# Patient Record
Sex: Male | Born: 1990
Health system: Southern US, Community
[De-identification: ages and names within clinical notes are randomized; demographics above are authoritative.]

## PROBLEM LIST (undated history)

## (undated) DIAGNOSIS — G4484 Primary exertional headache: Secondary | ICD-10-CM

## (undated) HISTORY — DX: Primary exertional headache: G44.84

## (undated) HISTORY — PX: SKIN BIOPSY: SHX1

---

## 2012-03-18 ENCOUNTER — Encounter (HOSPITAL_COMMUNITY): Payer: Self-pay | Admitting: Emergency Medicine

## 2012-03-18 ENCOUNTER — Emergency Department (HOSPITAL_COMMUNITY)
Admission: EM | Admit: 2012-03-18 | Discharge: 2012-03-18 | Disposition: A | Discharge status: Home / Self Care | Payer: BC Managed Care – PPO | Attending: Emergency Medicine | Admitting: Emergency Medicine

## 2012-03-18 DIAGNOSIS — J069 Acute upper respiratory infection, unspecified: Secondary | ICD-10-CM | POA: Insufficient documentation

## 2012-03-18 DIAGNOSIS — Z880 Allergy status to penicillin: Secondary | ICD-10-CM | POA: Insufficient documentation

## 2012-03-18 MED ORDER — PSEUDOEPHEDRINE HCL ER 120 MG PO TB12: 120.0000 mg | ORAL_TABLET | Freq: Two times a day (BID) | ORAL | Status: AC

## 2012-03-18 MED ORDER — DEXAMETHASONE 2 MG PO TABS
6.0000 mg | ORAL_TABLET | Freq: Once | ORAL | Status: AC
  Administered 2012-03-18: 6 mg via ORAL
  Filled 2012-03-18: qty 3

## 2012-03-18 NOTE — ED Provider Notes (Signed)
History     CSN: [id number]  Arrival date & time 03/18/12  0227   First MD Initiated Contact with Patient 03/18/12 0240      Chief Complaint  Patient presents with  . Nasal Congestion    (Consider location/radiation/quality/duration/timing/severity/associated sxs/prior treatment) HPI  No past medical history on file.  No past surgical history on file.  No family history on file.  History  Substance Use Topics  . Smoking status: Not on file  . Smokeless tobacco: Not on file  . Alcohol Use: Not on file      Review of Systems  Constitutional: Negative for chills and fatigue.  HENT: Positive for rhinorrhea, trouble swallowing, postnasal drip and sinus pressure. Negative for sore throat.   Respiratory: Negative for cough and shortness of breath.   Neurological: Negative for headaches.    Allergies  Penicillins  Home Medications   Current Outpatient Rx  Name Route Sig Dispense Refill  . ACETAMINOPHEN 325 MG PO TABS Oral Take 650 mg by mouth every 6 (six) hours as needed. For pain    . NYQUIL PO Oral Take by mouth daily as needed. For cough    . PSEUDOEPHEDRINE HCL ER 120 MG PO TB12 Oral Take 1 tablet (120 mg total) by mouth every 12 (twelve) hours. 20 tablet 0    BP 150/69  Pulse 88  Temp 98.2 F (36.8 C) (Oral)  Resp 18  SpO2 98%  Physical Exam  Constitutional: He appears well-developed and well-nourished.  HENT:  Mouth/Throat: Uvula is midline. Uvula swelling present.  Eyes: Pupils are equal, round, and reactive to light.  Neck: Normal range of motion.  Cardiovascular: Normal rate.   Abdominal: Soft.  Skin: Skin is warm.    ED Course  Procedures (including critical care time)   Labs Reviewed  RAPID STREP SCREEN  LAB REPORT - SCANNED   No results found.   1. URI (upper respiratory infection)       MDM          Arman Filter, NP 03/19/12 0501

## 2012-03-18 NOTE — ED Notes (Addendum)
Pt has cold; says he is having drainage. "Decided to run over here to make sure nothing serious." Throat is reddened and swollen and painful but pt is not febrile.

## 2012-03-19 NOTE — ED Provider Notes (Signed)
Medical screening examination/treatment/procedure(s) were performed by non-physician practitioner and as supervising physician I was immediately available for consultation/collaboration.  Olivia Mackie, MD 03/19/12 [phone]

## 2014-01-21 ENCOUNTER — Emergency Department (HOSPITAL_COMMUNITY)
Admission: EM | Admit: 2014-01-21 | Discharge: 2014-01-21 | Disposition: A | Discharge status: Home / Self Care | Payer: BC Managed Care – PPO | Attending: Emergency Medicine | Admitting: Emergency Medicine

## 2014-01-21 ENCOUNTER — Encounter (HOSPITAL_COMMUNITY): Payer: Self-pay | Admitting: Emergency Medicine

## 2014-01-21 DIAGNOSIS — F419 Anxiety disorder, unspecified: Secondary | ICD-10-CM

## 2014-01-21 DIAGNOSIS — F411 Generalized anxiety disorder: Secondary | ICD-10-CM | POA: Insufficient documentation

## 2014-01-21 DIAGNOSIS — Z880 Allergy status to penicillin: Secondary | ICD-10-CM | POA: Insufficient documentation

## 2014-01-21 DIAGNOSIS — Z79899 Other long term (current) drug therapy: Secondary | ICD-10-CM | POA: Insufficient documentation

## 2014-01-21 LAB — CBG MONITORING, ED: GLUCOSE-CAPILLARY: 80 mg/dL (ref 70–99)

## 2014-01-21 MED ORDER — LORAZEPAM 1 MG PO TABS
0.5000 mg | ORAL_TABLET | Freq: Once | ORAL | Status: AC
  Administered 2014-01-21: 0.5 mg via ORAL
  Filled 2014-01-21: qty 1

## 2014-01-21 MED ORDER — LORAZEPAM 0.5 MG PO TABS: 0.5000 mg | ORAL_TABLET | Freq: Three times a day (TID) | ORAL | Status: DC | PRN

## 2014-01-21 NOTE — ED Provider Notes (Signed)
CSN: [id number]     Arrival date & time 01/21/14  1925 History   First MD Initiated Contact with Patient 01/21/14 2012     Chief Complaint  Patient presents with  . Anxiety     (Consider location/radiation/quality/duration/timing/severity/associated sxs/prior Treatment) HPI  The patient presents to the emergency departments with complaints of anxiety and persistent thoughts about death and dying for the past 2-3 days. He says that his on and had a near death experience last week and since then he can't seem to stop thinking about and being worried about him or those is close to dying. He spoke with his dad he was out of town and a psychiatrist who recommended he seek out medical help this evening. He says he is still eating and drinking and keep up with ADLs. But he has been anxious, Thirsty, heart racing, chest tightness that is intermittent. He says he had this problem before as a child but since his dad is a psychiatrist he was able to talk him through it. He denies having SI/HI, hallucinations or delusions. He denies feeling like he needs inpatient treatment but wanted help to get rid of "this feeling".  History reviewed. No pertinent past medical history. History reviewed. No pertinent past surgical history. No family history on file. History  Substance Use Topics  . Smoking status: Never Smoker   . Smokeless tobacco: Not on file  . Alcohol Use: Yes    Review of Systems  All other systems reviewed and are negative.     Allergies  Penicillins  Home Medications   Prior to Admission medications   Medication Sig Start Date End Date Taking? Authorizing Provider  OVER THE COUNTER MEDICATION Take 1 tablet by mouth as needed (for allergies). Pt doesn't know name or strength of allergy medicine   Yes Historical Provider, MD  LORazepam (ATIVAN) 0.5 MG tablet Take 1 tablet (0.5 mg total) by mouth 3 (three) times daily as needed for anxiety. 01/21/14   Chantrell Apsey Irine Seal, PA-C   BP  142/77  Pulse 81  Temp(Src) 98.6 F (37 C) (Oral)  Resp 25  Ht 5\' 10"  (1.778 m)  Wt 214 lb (97.07 kg)  BMI 30.71 kg/m2  SpO2 99% Physical Exam  Nursing note and vitals reviewed. Constitutional: He appears well-developed and well-nourished. No distress.  HENT:  Head: Normocephalic and atraumatic.  Eyes: Pupils are equal, round, and reactive to light.  Neck: Normal range of motion. Neck supple.  Cardiovascular: Normal rate and regular rhythm.   Pulmonary/Chest: Effort normal.  Abdominal: Soft.  Neurological: He is alert.  Skin: Skin is warm and dry.  Psychiatric: His speech is normal and behavior is normal. Judgment normal. His mood appears anxious. Cognition and memory are normal. He expresses no homicidal and no suicidal ideation. He expresses no suicidal plans and no homicidal plans.    ED Course  Procedures (including critical care time) Labs Review Labs Reviewed  CBG MONITORING, ED    Imaging Review No results found.   EKG Interpretation None      MDM   Final diagnoses:  Anxiety    No SI or HI patient does not appear to be in significant distress and is competent. He does not want to wait to speak without our counselors. Will go to behavioral health tomorrow as a walk in. Given  Medications  LORazepam (ATIVAN) tablet 0.5 mg (0.5 mg Oral Given 01/21/14 2100)    in the ED which he says really helped him out a  lot. Will give him an Rx for the same to help bridge until his daily medication that he [facility] starts to work.  22 y.o.Casimiro NeedleMichael Caligiuri's evaluation in the Emergency Department is complete. It has been determined that no acute conditions requiring further emergency intervention are present at this time. The patient/guardian have been advised of the diagnosis and plan. We have discussed signs and symptoms that warrant return to the ED, such as changes or worsening in symptoms.  Vital signs are stable at discharge. Filed Vitals:   01/21/14  2215  BP: 116/59  Pulse: 72  Temp:   Resp: 17    Patient/guardian has voiced understanding and agreed to follow-up with the PCP or specialist.     Dorthula Matasiffany G Karry Causer, PA-C 01/21/14 2232

## 2014-01-21 NOTE — Discharge Instructions (Signed)
Panic Attacks °Panic attacks are sudden, short-lived surges of severe anxiety, fear, or discomfort. They may occur for no reason when you are relaxed, when you are anxious, or when you are sleeping. Panic attacks may occur for a number of reasons:  °· Healthy people occasionally have panic attacks in extreme, life-threatening situations, such as war or natural disasters. Normal anxiety is a protective mechanism of the body that helps us react to danger (fight or flight response). °· Panic attacks are often seen with anxiety disorders, such as panic disorder, social anxiety disorder, generalized anxiety disorder, and phobias. Anxiety disorders cause excessive or uncontrollable anxiety. They may interfere with your relationships or other life activities. °· Panic attacks are sometimes seen with other mental illnesses, such as depression and posttraumatic stress disorder. °· Certain medical conditions, prescription medicines, and drugs of abuse can cause panic attacks. °SYMPTOMS  °Panic attacks start suddenly, peak within 20 minutes, and are accompanied by four or more of the following symptoms: °· Pounding heart or fast heart rate (palpitations). °· Sweating. °· Trembling or shaking. °· Shortness of breath or feeling smothered. °· Feeling choked. °· Chest pain or discomfort. °· Nausea or strange feeling in your stomach. °· Dizziness, light-headedness, or feeling like you will faint. °· Chills or hot flushes. °· Numbness or tingling in your lips or hands and feet. °· Feeling that things are not real or feeling that you are not yourself. °· Fear of losing control or going crazy. °· Fear of dying. °Some of these symptoms can mimic serious medical conditions. For example, you may think you are having a heart attack. Although panic attacks can be very scary, they are not life threatening. °DIAGNOSIS  °Panic attacks are diagnosed through an assessment by your health care provider. Your health care provider will ask  questions about your symptoms, such as where and when they occurred. Your health care provider will also ask about your medical history and use of alcohol and drugs, including prescription medicines. Your health care provider may order blood tests or other studies to rule out a serious medical condition. Your health care provider may refer you to a mental health professional for further evaluation. °TREATMENT  °· Most healthy people who have one or two panic attacks in an extreme, life-threatening situation will not require treatment. °· The treatment for panic attacks associated with anxiety disorders or other mental illness typically involves counseling with a mental health professional, medicine, or a combination of both. Your health care provider will help determine what treatment is best for you. °· Panic attacks due to physical illness usually go away with treatment of the illness. If prescription medicine is causing panic attacks, talk with your health care provider about stopping the medicine, decreasing the dose, or substituting another medicine. °· Panic attacks due to alcohol or drug abuse go away with abstinence. Some adults need professional help in order to stop drinking or using drugs. °HOME CARE INSTRUCTIONS  °· Take all medicines as directed by your health care provider.   °· Schedule and attend follow-up visits as directed by your health care provider. It is important to keep all your appointments. °SEEK MEDICAL CARE IF: °· You are not able to take your medicines as prescribed. °· Your symptoms do not improve or get worse. °SEEK IMMEDIATE MEDICAL CARE IF:  °· You experience panic attack symptoms that are different than your usual symptoms. °· You have serious thoughts about hurting yourself or others. °· You are taking medicine for panic attacks and   have a serious side effect. °MAKE SURE YOU: °· Understand these instructions. °· Will watch your condition. °· Will get help right away if you are not  doing well or get worse. °Document Released: 06/22/2005 Document Revised: 06/27/2013 Document Reviewed: 02/03/2013 °ExitCare® Patient Information ©2015 [employer]. This information is not intended to replace advice given to you by your health care provider. Make sure you discuss any questions you have with your health care provider. ° °Emergency Department Resource Guide °1) Find a Doctor and Pay Out of Pocket °Although you won't have to find out who is covered by your insurance plan, it is a good idea to ask around and get recommendations. You will then need to call the office and see if the doctor you have chosen will accept you as a new patient and what types of options they offer for patients who are self-pay. Some doctors offer discounts or will set up payment plans for their patients who do not have insurance, but you will need to ask so you aren't surprised when you get to your appointment. ° °2) Contact Your Local Health Department °Not all health departments have doctors that can see patients for sick visits, but many do, so it is worth a call to see if yours does. If you don't know where your local health department is, you can check in your phone book. The CDC also has a tool to help you locate your state's health department, and many state websites also have listings of all of their local health departments. ° °3) Find a Walk-in Clinic °If your illness is not likely to be very severe or complicated, you may want to try a walk in clinic. These are popping up all over the country in pharmacies, drugstores, and shopping centers. They're usually staffed by nurse practitioners or physician assistants that have been trained to treat common illnesses and complaints. They're usually fairly quick and inexpensive. However, if you have serious medical issues or chronic medical problems, these are probably not your best option. ° °No Primary Care Doctor: °- Call [facility] at  832-8000 - they can help you  locate a primary care doctor that  accepts your insurance, provides certain services, etc. °- Physician Referral Service- 1-800-533-3463 ° °Chronic Pain Problems: °Organization         Address  Phone   Notes  ° [facility]  (336) 297-2271 Patients need to be referred by their primary care doctor.  ° °Medication Assistance: °Organization         Address  Phone   Notes  °[place] [facility] [address] °[place], [place] [address] (336) 641-8030 --Must be a resident of [place] °-- Must have NO insurance coverage whatsoever (no Medicaid/ Medicare, etc.) °-- The pt. MUST have a primary care doctor that directs their care regularly and follows them in the community °  °[facility]  (866) 331-1348   °[employer]  (888) 892-1162   ° °Agencies that provide inexpensive medical care: °Organization         Address  Phone   Notes  °[facility]  (336) 832-8035   ° Internal Medicine    (336) 832-7272   °[facility] [address] °[place], [place] [address] (336) 832-4777   °[facility] [address], °[place] (336) 271-4999   °[facility]    (336) 373-0678   °[facility]    (336) 272-1050   °  [facility] ° [address], [place] Phone:  (336) 832-4444, Fax:  (336) 832-4440 Hours of Operation:  9 am - 6 pm, M-F.  Also accepts Medicaid/Medicare and self-pay.  °[facility] ° [address], [place] Phone: (336) 832-3150, Fax: (336) [phone]. Hours of Operation:  8:30 am - 5:30 pm, M-F.  Also accepts Medicaid and self-pay.  °[facility] [place] [address], [place] Phone: (336) 878-6027   °[facility] [address], [place], [place] (336)723-1848, Ext. 123 Mondays & Thursdays: 7-9 AM.  First 15 patients are seen on a first come, first serve basis. °  ° °Medicaid-accepting [place]  Providers: ° °Organization         Address  Phone   Notes  °[facility] [address], [place] (336) 641-2100 Also accepts self-pay patients.  °[facility] [address], [place] ° (336) 856-9996   °[facility] [address], [place] (336) 288-8857   °[facility] [address] [place] [address], [place] (336) 299-7000   °Veita Bland [address], [place]  ° (336) 373-1557 Only accepts [place] Access Medicaid patients after they have their name applied to their card.  ° °Self-Pay (no insurance) in [place]: ° °Organization         Address  Phone   Notes  °Sickle Cell Patients, [facility] [address], [place] (336) 832-1970   °[facility] [address], [place] (336) 832-4400   °[facility] ° [address], [place] (336) 992-4800   °[facility]/Dr. Osei-Bonsu ° [address] [place] [address], [place] or [address], [place] (336) 841-8500 Phone number for both [place] and [place] locations is the same.  °[facility] [address], [place] (336) 299-0000   °[facility] [address] [place] [address], [place] or [address] (336) 852-7530 °(336) 878-2260   °[facility] [address], [place] (336) 350-1642, phone; (336) 294-5005, fax Sees patients 1st and 3rd Saturday of every month.  Must not qualify for public or private insurance (i.e. Medicaid, Medicare, [place] Health Choice, Veterans' Benefits) • Household income should be no more than 200% of the poverty level •The clinic cannot treat you if you are pregnant or think you are pregnant • Sexually transmitted diseases are not treated at the clinic.  ° ° °Dental Care: °Organization         Address  Phone  Notes  °[facility] [address], [place] (336) 641-6152 Accepts children up to age 21 who are enrolled in Medicaid or [place] Health Choice; pregnant women with a Medicaid card; and children who have applied for Medicaid or [place] Health Choice, but were declined, whose parents can pay a reduced fee at time of service.  °[facility] [place]  [address], [place] (336) 641-7733 Accepts children up to age 21 who are enrolled in Medicaid or [place] Health Choice; pregnant women with a Medicaid card; and children who have applied for Medicaid or [place] Health Choice, but were declined, whose parents can pay a reduced fee at time of service.  °[facility] ° [address], [place] (336) 641-4533 Patients are seen by appointment only. Walk-ins are not accepted. [facility] will see patients 18 years of age and older. °Monday - Tuesday (8am-5pm) °Most Wednesdays (8:30-5pm) °$30 per visit, cash only  °[facility] ° [address], [place] (336) 641-4533 Patients are seen by appointment only. Walk-ins are not accepted. [facility] will see patients 18 years of age and older. °One Wednesday Evening (Monthly: Volunteer Based).  $30 per visit, cash only  °[facility]  (919) 537-3737 for adults; Children under age 4, call Graduate Pediatric Dentistry at (919) [phone]. Children aged 4-14, please call (919) [phone] to request a pediatric application. ° Dental services are provided in all areas of dental care including fillings, crowns and bridges, complete and partial dentures, implants, gum treatment, root canals, and extractions. Preventive care is also provided. Treatment is provided to both adults and children. °Patients are selected via a lottery and there is often a waiting list. °  °[facility] [address], °[place] ° (336) 763-8833 [email or web address] °  °[facility]  [address], [place], [place] (336)723-1848, Ext. 123 Second and Fourth Thursday of each month, opens at 6:30 AM; Clinic ends at 9 AM.  Patients are seen on a first-come first-served basis, and a limited number are seen during each clinic.  ° °[facility] ° [address], [place], [place] (336) 723-7904   Eligibility Requirements °You must have lived in [place], [place], or [place] counties for at least the last three months. °  You cannot be eligible for state or federal sponsored healthcare insurance, including Veterans Administration, Medicaid, or Medicare. °  You generally cannot be eligible for healthcare insurance through your employer.  °  How to apply: °Eligibility screenings are held every Tuesday and Wednesday afternoon from 1:00 pm until 4:00 pm. You do not need an appointment for the interview!  °[facility] [address], [place], [place] 336-631-2330   °[facility]  336-342-8273   °[facility]  336-703-3100   °[facility]  336-570-6415   ° °Behavioral Health Resources in the Community: °Intensive Outpatient Programs °Organization         Address  Phone  Notes  °[facility] [address], [place], [place] 336-878-6098   °[facility] [address], [place], [place] 336-832-9800   °[facility] [address], [place], [place] ° 336-882-2125   °[facility] [address],  °[place], [place] 1-800-853-5163 or 336-641-4981   °Substance Abuse Resources °Organization         Address  Phone  Notes  °[facility]  336-882-2125   °[facility]  336-784-9470   °[facility]  336-285-9073   °[facility]  336-845-3988   °Residential & Outpatient Substance Abuse Program  1-800-659-3381   °Psychological Services °Organization         Address  Phone  Notes  °[facility]  336- 832-9600     °[facility]  336- 378-7881   °[facility] [address], [place] 1-800-853-5163 or 336-641-4981   ° °Mobile Crisis Teams °Organization         Address  Phone  Notes  °[facility], Mobile Crisis Care Unit  1-877-626-1772   °Assertive °[facility] ° [address] [place], [place] 336-834-9664   °  Sharon DeEsch [address] °[place] 336-554-5454   ° °Self-Help/Support Groups °Organization         Address  Phone             Notes  °[facility] [place] - variety of support groups  336- 373-1402 Call for more information  °Narcotics Anonymous (NA), [facility] [address], °[place]  2 meetings at this location  ° °Residential Treatment Programs °Organization         Address  Phone  Notes  °[facility] [address],    °[place]  1-866-801-8205   °[facility] ° [address], [place], [place] 704-293-8524   °[facility] [address], [place] 336-845-3988 Admissions: 8am-3pm M-F  °[facility] [address]    °[place], [place] 336-841-1104   °[facility] [address], [place], [place] 336-379-7146   °[facility] [address]  °[place], [place] 336-285-9073   °[facility] - Intensive Outpatient [address], [place], [place] 336-852-3033   °[facility] [address]  °[place], [place] 1-877-615-2722 or 336-784-9470   °[facility] [address] [place], [place] 336-227-7417 Accepts Medicaid  °[facility] [address]  ° [place] 1-800-659-3381 Substance Abuse/Addiction Treatment  ° °[place] Behavioral Health Resources °Organization         Address  Phone  Notes  °[facility]  (888) 581-9988   °Julie Brannon, PhD [address] [place], [place]   (336) 349-5553 or (336) 951-0000    °[facility]   [address] °[place], [place] (336) 349-4454   °[facility] [address], [place], [place] (336) 342-8316 Insurance/Medicaid/sponsorship through [facility]  °[facility] [address]                                    [place], [place] (336) 342-8316 Therapy/tele-psych/case  °[facility] [address]  ° [place],  (336) 349-2233    °Dr. Arfeen  (336) 349-4544   °[facility] [place]  [employer] [place] [facility] 1) [address], [place] °2) [address], [place] °3)  [address], [place] (336) 349-3220 °(336) 342-7768 ° °(336) 342-8140   °[place] [facility] (336) 342-1394 or (336) 342-3537 (After Hours)    ° ° ° °

## 2014-01-21 NOTE — ED Provider Notes (Signed)
Medical screening examination/treatment/procedure(s) were performed by non-physician practitioner and as supervising physician I was immediately available for consultation/collaboration.   EKG Interpretation None        Anastasio Wogan M Kenzlee Fishburn, MD 01/21/14 2354 

## 2014-01-21 NOTE — ED Notes (Signed)
The pt has anxiety issues for 2-3 days.  He has personal rpoblems

## 2017-03-18 ENCOUNTER — Ambulatory Visit (INDEPENDENT_AMBULATORY_CARE_PROVIDER_SITE_OTHER): Payer: BLUE CROSS/BLUE SHIELD | Admitting: Podiatry

## 2017-03-18 ENCOUNTER — Encounter: Payer: Self-pay | Admitting: Podiatry

## 2017-03-18 VITALS — BP 110/70 | HR 92 | Resp 16

## 2017-03-18 DIAGNOSIS — L600 Ingrowing nail: Secondary | ICD-10-CM | POA: Diagnosis not present

## 2017-03-18 MED ORDER — NEOMYCIN-POLYMYXIN-HC 1 % OT SOLN: OTIC | 1 refills | Status: DC

## 2017-03-18 NOTE — Patient Instructions (Signed)

## 2017-03-18 NOTE — Progress Notes (Signed)
  Subjective:  Patient ID: Richard Glenn, male    DOB: 06/25/1991,  MRN: [id number] HPI Chief Complaint  Patient presents with  . Toe Pain    Hallux nail left - lateral border, sharp when pressed x 2 weeks, no treamtment    26 y.o. male presents with the above complaint.   He presents today complaining of painful ingrown toenail to the fibular border of the hallux left today. States is been present for quite some time has had a removed in the past but it grew back.  No past medical history on file. No past surgical history on file. No current outpatient prescriptions on file.  Allergies  Allergen Reactions  . Penicillins Other (See Comments)    Unknown. Pt says he thinks it's something similar but not entirely sure   Review of Systems  All other systems reviewed and are negative.  Objective:   Vitals:   03/18/17 0946  BP: 110/70  Pulse: 92  Resp: 16   General AA&O x3. Patient is alert and oriented.  Vascular Dorsalis pedis and posterior tibial pulses 2/4 bilat. Brisk capillary refill to all digits. Pedal hair present.  Neurologic Epicritic sensation grossly intact.  Dermatologic No open lesions. Interspaces clear of maceration. Nails well groomed and normal in appearance.Sharp incurvated nail margin along the fibular border hallux left. There is mild purulence mild Malott malodor with Sharp incurvated nail margin.   Orthopedic: MMT 5/5 in dorsiflexion, plantarflexion, inversion, and eversion. Normal joint ROM without pain or crepitus.   Radiographs:  None needed  Assessment & Plan:   Assessment: Ingrown toenail paronychia abscess hallux left. Fibular border.  Plan: We discussed the etiology pathology conservative versus surgical therapies. At this point we performed a chemical matrixectomy to the fibular border of the hallux left. He tolerated this procedure well after local anesthesia was administered. He was provided with oral and written home-going instructions  for care and soaking of his toe as well as a prescription for Cortisporin Otic to be applied twice daily after soaking. Follow up with him in 2 weeks.     Benita Boonstra T. KremlinHyatt, North DakotaDPM

## 2017-03-19 ENCOUNTER — Telehealth: Payer: Self-pay | Admitting: *Deleted

## 2017-03-19 NOTE — Telephone Encounter (Signed)
Pt states due to weather would it be okay to start with epsom salt soaks, then later switch to betadine. I told pt that would be okay.

## 2017-04-08 ENCOUNTER — Encounter: Payer: Self-pay | Admitting: Podiatry

## 2017-04-08 ENCOUNTER — Ambulatory Visit (INDEPENDENT_AMBULATORY_CARE_PROVIDER_SITE_OTHER): Payer: BLUE CROSS/BLUE SHIELD | Admitting: Podiatry

## 2017-04-08 ENCOUNTER — Ambulatory Visit: Payer: BLUE CROSS/BLUE SHIELD | Admitting: Podiatry

## 2017-04-08 DIAGNOSIS — L600 Ingrowing nail: Secondary | ICD-10-CM

## 2017-04-08 NOTE — Patient Instructions (Signed)

## 2017-04-09 NOTE — Progress Notes (Signed)
He presents today for follow-up of the hallux nail left. States this seems to be doing very well and is nontender. He states these no longer soaking it. Her graft objective: Vital signs are stable alert and oriented 3. Pulses are palpable. There is mild erythema at the proximal fibular border of the hallux left. It is mildly tender on palpation.  Assessment: Well-healing surgical toe still mild drainage and tenderness.  Plan:: I recommended that he continue to soak at least once daily in Epsom salts and warm water was for signs and symptoms of infection notify me if there are any.

## 2017-04-27 ENCOUNTER — Ambulatory Visit (INDEPENDENT_AMBULATORY_CARE_PROVIDER_SITE_OTHER): Payer: BLUE CROSS/BLUE SHIELD | Admitting: Podiatry

## 2017-04-27 ENCOUNTER — Encounter: Payer: Self-pay | Admitting: Podiatry

## 2017-04-27 DIAGNOSIS — L600 Ingrowing nail: Secondary | ICD-10-CM

## 2017-04-27 MED ORDER — NEOMYCIN-POLYMYXIN-HC 1 % OT SOLN: OTIC | 1 refills | Status: DC

## 2017-04-27 NOTE — Progress Notes (Signed)
He presents today with a chief complaint of ingrown toenail to the lateral border of the hallux right.  Objective: Pulses are palpable. Sharp incurvated nail margin with erythema and edema to the fibular border. Painful on palpation.  Assessment: Ingrown nail paronychia abscess hallux right.  Plan: Chemical matrixectomy was performed today after local anesthesia was administered. He tolerated the procedure well. He was provided with both oral and then home-going instructions. He was provided with a prescription for Cortisporin Otic to be applied twice daily after soaking. I will follow-up with him in 2 weeks.

## 2017-04-27 NOTE — Patient Instructions (Signed)

## 2017-05-11 ENCOUNTER — Ambulatory Visit (INDEPENDENT_AMBULATORY_CARE_PROVIDER_SITE_OTHER): Payer: BLUE CROSS/BLUE SHIELD | Admitting: Podiatry

## 2017-05-11 ENCOUNTER — Encounter: Payer: Self-pay | Admitting: Podiatry

## 2017-05-11 DIAGNOSIS — L600 Ingrowing nail: Secondary | ICD-10-CM

## 2017-05-11 NOTE — Patient Instructions (Signed)

## 2017-05-11 NOTE — Progress Notes (Signed)
He presents today for follow-up of his matrixectomy hallux right fibular border. He states that is doing real well. It has a scab on it so I have not been soaking it.  Objective: Vital signs are stable he is alert and oriented 3. Pulses are palpable. No erythema cellulitis drainage or odor eschar to the lateral border is present. I see no signs of drainage at this point.  Assessment: Well-healing surgical toe hallux right fibular border.  Plan: Discussed etiology pathology conservative versus surgical therapies will follow up with him as needed. He will watch sun symptoms of infection.

## 2018-07-13 ENCOUNTER — Encounter (HOSPITAL_COMMUNITY): Payer: Self-pay | Admitting: Emergency Medicine

## 2018-07-13 ENCOUNTER — Emergency Department (HOSPITAL_COMMUNITY)
Admission: EM | Admit: 2018-07-13 | Discharge: 2018-07-14 | Disposition: A | Discharge status: Home / Self Care | Payer: 59 | Attending: Emergency Medicine | Admitting: Emergency Medicine

## 2018-07-13 DIAGNOSIS — Z79899 Other long term (current) drug therapy: Secondary | ICD-10-CM | POA: Diagnosis not present

## 2018-07-13 DIAGNOSIS — H9201 Otalgia, right ear: Secondary | ICD-10-CM | POA: Diagnosis not present

## 2018-07-13 NOTE — ED Triage Notes (Signed)
Pt reports right ear pain X3 days.  Pt went to an ENT provider today, got medication however the pain is not controlled.  Has been managing w/ OTC however it is not working now.

## 2018-07-14 MED ORDER — HYDROCODONE-ACETAMINOPHEN 5-325 MG PO TABS
1.0000 | ORAL_TABLET | Freq: Once | ORAL | Status: AC
  Administered 2018-07-14: 1 via ORAL
  Filled 2018-07-14: qty 1

## 2018-07-14 MED ORDER — ONDANSETRON HCL 4 MG PO TABS: 4.0000 mg | ORAL_TABLET | Freq: Three times a day (TID) | ORAL | 0 refills | Status: DC | PRN

## 2018-07-14 MED ORDER — HYDROCODONE-ACETAMINOPHEN 5-325 MG PO TABS: 1.0000 | ORAL_TABLET | Freq: Four times a day (QID) | ORAL | 0 refills | Status: DC | PRN

## 2018-07-14 MED ORDER — ONDANSETRON 4 MG PO TBDP
4.0000 mg | ORAL_TABLET | Freq: Once | ORAL | Status: AC
Start: 2018-07-14 — End: 2018-07-14
  Administered 2018-07-14: 4 mg via ORAL
  Filled 2018-07-14: qty 1

## 2018-07-14 NOTE — ED Provider Notes (Signed)
[facility] Provider Note   CSN: [id number] Arrival date & time: 07/13/18  2330     History   Chief Complaint Chief Complaint  Patient presents with  . Otalgia    HPI Richard Glenn is a 28 y.o. male who presents to the emergency department chief complaint of right ear pain.  [facility] diagnosed with otitis externa.  He had an ear wick placed and was placed on eardrops.  He states at that time he was taking Motrin and Tylenol and his pain was approximately 7 out of 10.  He felt that he could handle it with over-the-counter medication.  However tonight his ear began throbbing and is now 9 out of 10.  He has been unable to sleep.  He denies fevers chills neck stiffness severe headache pain in his mastoid rashes.   HPI  History reviewed. No pertinent past medical history.  There are no active problems to display for this patient.   History reviewed. No pertinent surgical history.      Home Medications    Prior to Admission medications   Medication Sig Start Date End Date Taking? Authorizing Provider  citalopram (CELEXA) 20 MG tablet Take 20 mg by mouth daily. 03/09/17   [provider]  HYDROcodone-acetaminophen (NORCO) 5-325 MG tablet Take 1-2 tablets by mouth every 6 (six) hours as needed for moderate pain. 07/14/18   Nailah Luepke, PA-C  NEOMYCIN-POLYMYXIN-HYDROCORTISONE (CORTISPORIN) 1 % SOLN OTIC solution Apply 1-2 drops to toe BID after soaking 04/27/17   Hyatt, Max T, DPM  ondansetron (ZOFRAN) 4 MG tablet Take 1 tablet (4 mg total) by mouth every 8 (eight) hours as needed for nausea or vomiting. 07/14/18   Arthor CaptainHarris, Abbygayle Helfand, PA-C    Family History No family history on file.  Social History Social History   Tobacco Use  . Smoking status: Never Smoker  . Smokeless tobacco: Never Used  Substance Use Topics  . Alcohol use: Yes  . Drug use: Not on file     Allergies    Penicillins   Review of Systems Review of Systems Ten systems reviewed and are negative for acute change, except as noted in the HPI.    Physical Exam Updated Vital Signs BP (!) 147/86 (BP Location: Right Arm)   Pulse (!) 101   Temp 98 F (36.7 C) (Oral)   Resp 16   Ht 5\' 11"  (1.803 m)   Wt 117 kg   SpO2 98%   BMI 35.98 kg/m   Physical Exam Vitals signs and nursing note reviewed.  Constitutional:      General: He is not in acute distress.    Appearance: He is well-developed. He is not diaphoretic.  HENT:     Head: Normocephalic and atraumatic.     Comments:   No mastoid tenderness.  Ear wick in place in the left ear occluding the canal.    Left Ear: Tympanic membrane normal.  Eyes:     General: No scleral icterus.    Conjunctiva/sclera: Conjunctivae normal.  Neck:     Musculoskeletal: Normal range of motion and neck supple.  Cardiovascular:     Rate and Rhythm: Normal rate and regular rhythm.     Heart sounds: Normal heart sounds.  Pulmonary:     Effort: Pulmonary effort is normal. No respiratory distress.     Breath sounds: Normal breath sounds.  Abdominal:     Palpations: Abdomen is soft.  Tenderness: There is no abdominal tenderness.  Skin:    General: Skin is warm and dry.  Neurological:     Mental Status: He is alert.  Psychiatric:        Behavior: Behavior normal.      ED Treatments / Results  Labs (all labs ordered are listed, but only abnormal results are displayed) Labs Reviewed - No data to display  EKG None  Radiology No results found.  Procedures Procedures (including critical care time)  Medications Ordered in ED Medications  HYDROcodone-acetaminophen (NORCO/VICODIN) 5-325 MG per tablet 1 tablet (1 tablet Oral Given 07/14/18 0303)  ondansetron (ZOFRAN-ODT) disintegrating tablet 4 mg (4 mg Oral Given 07/14/18 0303)     Initial Impression / Assessment and Plan / ED Course  I have reviewed the triage vital signs and the nursing  notes.  Pertinent labs & imaging results that were available during my care of the patient were reviewed by me and considered in my medical decision making (see chart for details).     PDMP reviewed during this encounter. Patient with ear pain worsening at night.  Unable to sleep.  I reviewed the PDMP and patient will be discharged with Norco and Zofran.  Discussed return precautions.  Appears appropriate for discharge at this time  Final Clinical Impressions(s) / ED Diagnoses   Final diagnoses:  Right ear pain    ED Discharge Orders         Ordered    HYDROcodone-acetaminophen (NORCO) 5-325 MG tablet  Every 6 hours PRN     07/14/18 0316    ondansetron (ZOFRAN) 4 MG tablet  Every 8 hours PRN,   Status:  Discontinued     07/14/18 0316    ondansetron (ZOFRAN) 4 MG tablet  Every 8 hours PRN     07/14/18 0319           Arthor Captain, PA-C 07/14/18 0323    Dione Booze, MD 07/14/18 [phone]

## 2018-07-14 NOTE — Discharge Instructions (Addendum)
You were given narcotic and or sedative medications while in the emergency department. Do not drive. Do not use machinery or power tools. Do not sign legal documents. Do not drink alcohol. Do not take sleeping pills. Do not supervise children by yourself. Do not participate in activities that require climbing or being in high places.  Get help right away if: You have a severe headache. You have a stiff neck. You have trouble swallowing. You have redness or swelling behind your ear. You have fluid or blood coming from your ear. You have hearing loss. You feel dizzy.

## 2018-07-26 ENCOUNTER — Telehealth: Payer: Self-pay

## 2018-07-26 NOTE — Telephone Encounter (Signed)
SENT REFERRAL TO SCHEDULING AND FILED NOTES 

## 2018-10-10 ENCOUNTER — Telehealth: Payer: 59 | Admitting: Family

## 2018-10-10 DIAGNOSIS — R6889 Other general symptoms and signs: Principal | ICD-10-CM

## 2018-10-10 DIAGNOSIS — Z20822 Contact with and (suspected) exposure to covid-19: Secondary | ICD-10-CM

## 2018-10-10 MED ORDER — ALBUTEROL SULFATE HFA 108 (90 BASE) MCG/ACT IN AERS: 2.0000 | INHALATION_SPRAY | Freq: Four times a day (QID) | RESPIRATORY_TRACT | 0 refills | Status: DC | PRN

## 2018-10-10 MED ORDER — BENZONATATE 100 MG PO CAPS: 100.0000 mg | ORAL_CAPSULE | Freq: Three times a day (TID) | ORAL | 0 refills | Status: DC | PRN

## 2018-10-10 NOTE — Progress Notes (Signed)
E-Visit for Corona Virus Screening  Based on your current symptoms, you may very well have the virus, however your symptoms are mild. Currently, not all patients are being tested. If the symptoms are mild and there is not a known exposure, performing the test is not indicated.  Approximately 5 minutes was spent documenting and reviewing patient's chart.   Coronavirus disease 2019 (COVID-19) is a respiratory illness that can spread from person to person. The virus that causes COVID-19 is a new virus that was first identified in the country of [place] but is now found in multiple other countries and has spread to the [place].  Symptoms associated with the virus are mild to severe fever, cough, and shortness of breath. There is currently no vaccine to protect against COVID-19, and there is no specific antiviral treatment for the virus.   To be considered HIGH RISK for Coronavirus (COVID-19), you have to meet the following criteria:  . Traveled to [place], [place], [place], [place] or [place]; or in the [place] to [place], [place], [place], or [place]; and have fever, cough, and shortness of breath within the last 2 weeks of travel OR  . Been in close contact with a person diagnosed with COVID-19 within the last 2 weeks and have fever, cough, and shortness of breath  . IF YOU DO NOT MEET THESE CRITERIA, YOU ARE CONSIDERED LOW RISK FOR COVID-19.   It is vitally important that if you feel that you have an infection such as this virus or any other virus that you stay home and away from places where you may spread it to others.  You should self-quarantine for 14 days if you have symptoms that could potentially be coronavirus and avoid contact with people age 65 and older.   You can use medication such as A prescription cough medication called Tessalon Perles 100 mg. You may take 1-2 capsules every 8 hours as needed for cough and A prescription inhaler called Albuterol MDI 90 mcg /actuation 2  puffs every 4 hours as needed for shortness of breath, wheezing, cough.  You may also take acetaminophen (Tylenol) as needed for fever.   Reduce your risk of any infection by using the same precautions used for avoiding the common cold or flu:  . Wash your hands often with soap and warm water for at least 20 seconds.  If soap and water are not readily available, use an alcohol-based hand sanitizer with at least 60% alcohol.  . If coughing or sneezing, cover your mouth and nose by coughing or sneezing into the elbow areas of your shirt or coat, into a tissue or into your sleeve (not your hands). . Avoid shaking hands with others and consider head nods or verbal greetings only. . Avoid touching your eyes, nose, or mouth with unwashed hands.  . Avoid close contact with people who are sick. . Avoid places or events with large numbers of people in one location, like concerts or sporting events. . Carefully consider travel plans you have or are making. . If you are planning any travel outside or inside the [place], visit the CDC's Travelers' Health webpage for the latest health notices. . If you have some symptoms but not all symptoms, continue to monitor at home and seek medical attention if your symptoms worsen. . If you are having a medical emergency, call 911.  HOME CARE . Only take medications as instructed by your medical team. . Drink plenty of fluids and get plenty   of rest. . A steam or ultrasonic humidifier can help if you have congestion.   GET HELP RIGHT AWAY IF: . You develop worsening fever. . You become short of breath . You cough up blood. . Your symptoms become more severe MAKE SURE YOU   Understand these instructions.  Will watch your condition.  Will get help right away if you are not doing well or get worse.  Your e-visit answers were reviewed by a board certified advanced clinical practitioner to complete your personal care plan.  Depending on the condition, your plan  could have included both over the counter or prescription medications.  If there is a problem please reply once you have received a response from your provider. Your safety is important to us.  If you have drug allergies check your prescription carefully.    You can use MyChart to ask questions about today's visit, request a non-urgent call back, or ask for a work or school excuse for 24 hours related to this e-Visit. If it has been greater than 24 hours you will need to follow up with your provider, or enter a new e-Visit to address those concerns. You will get an e-mail in the next two days asking about your experience.  I hope that your e-visit has been valuable and will speed your recovery. Thank you for using e-visits.    

## 2019-08-01 ENCOUNTER — Other Ambulatory Visit: Payer: Self-pay

## 2019-08-01 ENCOUNTER — Ambulatory Visit
Admission: EM | Admit: 2019-08-01 | Discharge: 2019-08-01 | Disposition: A | Discharge status: Home / Self Care | Payer: 59 | Attending: Emergency Medicine | Admitting: Emergency Medicine

## 2019-08-01 ENCOUNTER — Encounter: Payer: Self-pay | Admitting: Emergency Medicine

## 2019-08-01 DIAGNOSIS — W51XXXA Accidental striking against or bumped into by another person, initial encounter: Secondary | ICD-10-CM | POA: Diagnosis not present

## 2019-08-01 DIAGNOSIS — S29012A Strain of muscle and tendon of back wall of thorax, initial encounter: Secondary | ICD-10-CM | POA: Diagnosis not present

## 2019-08-01 MED ORDER — CYCLOBENZAPRINE HCL 5 MG PO TABS: 5.0000 mg | ORAL_TABLET | Freq: Two times a day (BID) | ORAL | 0 refills | Status: AC | PRN

## 2019-08-01 MED ORDER — NAPROXEN 500 MG PO TABS: 500.0000 mg | ORAL_TABLET | Freq: Two times a day (BID) | ORAL | 0 refills | Status: AC

## 2019-08-01 NOTE — ED Notes (Signed)
Patient able to ambulate independently  

## 2019-08-01 NOTE — Discharge Instructions (Addendum)
Recommend RICE: rest, ice, compression, elevation as needed for pain.    Heat therapy (hot compress, warm wash red, hot showers, etc.) can help relax muscles and soothe muscle aches. Cold therapy (ice packs) can be used to help swelling both after injury and after prolonged use of areas of chronic pain/aches.  For pain: recommend 350 mg-1000 mg of Tylenol (acetaminophen) and/or 200 mg - 800 mg of Advil (ibuprofen, Motrin) every 8 hours as needed.  May alternate between the two throughout the day as they are generally safe to take together.  DO NOT exceed more than 3000 mg of Tylenol or 3200 mg of ibuprofen in a 24 hour period as this could damage your stomach, kidneys, liver, or increase your bleeding risk.  May take muscle relaxer as needed for severe pain / spasm.  (This medication may cause you to become tired so it is important you do not drink alcohol or operate heavy machinery while on this medication.  Recommend your first dose to be taken before bedtime to monitor for side effects safely) 

## 2019-08-01 NOTE — ED Triage Notes (Signed)
Pt presents to [facility] for assessment of left lower/mid back pain starting after he turned over in bed this morning and fell something pull sharply.  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

## 2019-08-01 NOTE — ED Provider Notes (Signed)
[facility]    CSN: [id number] Arrival date & time: 08/01/19  1543      History   Chief Complaint Chief Complaint  Patient presents with  . Back Pain    HPI Richard Glenn is a 29 y.o. male with history of obesity presenting for left mid to began after tripping over his mom this morning and feeling a pulling sensation.  Patient states pain will sometimes radiate down towards lower back: Denies radiation into gluteus, legs, neck.  Denies lower extremity weakness, paresthesia, swelling.  States pain is tight, worse with certain movements and deep inspiration.  Patient denies chest pain, cough, difficulty breathing.  Has tried ibuprofen, ice patch with some relief.  Denies fall or injury prior to symptom onset.   History reviewed. No pertinent past medical history.  There are no problems to display for this patient.   History reviewed. No pertinent surgical history.     Home Medications    Prior to Admission medications   Medication Sig Start Date End Date Taking? Authorizing Provider  citalopram (CELEXA) 20 MG tablet Take 20 mg by mouth daily. 03/09/17   [provider]  cyclobenzaprine (FLEXERIL) 5 MG tablet Take 1 tablet (5 mg total) by mouth 2 (two) times daily as needed for up to 5 days for muscle spasms. 08/01/19 08/06/19  Hall-Potvin, [place], PA-C  naproxen (NAPROSYN) 500 MG tablet Take 1 tablet (500 mg total) by mouth 2 (two) times daily for 14 days. 08/01/19 08/15/19  Hall-Potvin, [place], PA-C  albuterol (PROVENTIL HFA;VENTOLIN HFA) 108 (90 Base) MCG/ACT inhaler Inhale 2 puffs into the lungs every 6 (six) hours as needed for wheezing or shortness of breath. 10/10/18 08/01/19  Junie Spencer, FNP    Family History Family History  Problem Relation Age of Onset  . Throat cancer Father   . Hypertension Father     Social History Social History   Tobacco Use  . Smoking status: Never Smoker  . Smokeless tobacco: Never Used  Substance Use Topics    . Alcohol use: Yes  . Drug use: Not on file     Allergies   Patient has no known allergies.   Review of Systems As per HPI   Physical Exam Triage Vital Signs ED Triage Vitals [08/01/19 1622]  Enc Vitals Group     BP 137/87     Pulse Rate 88     Resp 18     Temp 97.6 F (36.4 C)     Temp Source Temporal     SpO2 95 %     Weight      Height      Head Circumference      Peak Flow      Pain Score 8     Pain Loc      Pain Edu?      Excl. in GC?    No data found.  Updated Vital Signs BP 137/87 (BP Location: Left Arm)   Pulse 88   Temp 97.6 F (36.4 C) (Temporal)   Resp 18   SpO2 95%   Visual Acuity Right Eye Distance:   Left Eye Distance:   Bilateral Distance:    Right Eye Near:   Left Eye Near:    Bilateral Near:     Physical Exam Constitutional:      General: He is not in acute distress. HENT:     Head: Normocephalic and atraumatic.  Eyes:     General: No scleral icterus.  Pupils: Pupils are equal, round, and reactive to light.  Cardiovascular:     Rate and Rhythm: Normal rate.  Pulmonary:     Effort: Pulmonary effort is normal. No respiratory distress.     Breath sounds: No wheezing.  Musculoskeletal:        General: Tenderness present. No swelling or deformity. Normal range of motion.     Thoracic back: No spasms. No scoliosis.       Back:     Right lower leg: No edema.     Left lower leg: No edema.  Skin:    Coloration: Skin is not jaundiced or pale.  Neurological:     Mental Status: He is alert and oriented to person, place, and time.      UC Treatments / Results  Labs (all labs ordered are listed, but only abnormal results are displayed) Labs Reviewed - No data to display  EKG   Radiology No results found.  Procedures Procedures (including critical care time)  Medications Ordered in UC Medications - No data to display  Initial Impression / Assessment and Plan / UC Course  I have reviewed the triage vital signs and  the nursing notes.  Pertinent labs & imaging results that were available during my care of the patient were reviewed by me and considered in my medical decision making (see chart for details).     Patient without trauma, acute distress: Likely musculoskeletal strain given H&P.  Likely having some spasm component as well: We will treat supportively as outlined below.  Return precautions discussed, patient verbalized understanding and is agreeable to plan. Final Clinical Impressions(s) / UC Diagnoses   Final diagnoses:  Strain of thoracic back region     Discharge Instructions     Recommend RICE: rest, ice, compression, elevation as needed for pain.    Heat therapy (hot compress, warm wash red, hot showers, etc.) can help relax muscles and soothe muscle aches. Cold therapy (ice packs) can be used to help swelling both after injury and after prolonged use of areas of chronic pain/aches.  For pain: recommend 350 mg-1000 mg of Tylenol (acetaminophen) and/or 200 mg - 800 mg of Advil (ibuprofen, Motrin) every 8 hours as needed.  May alternate between the two throughout the day as they are generally safe to take together.  DO NOT exceed more than 3000 mg of Tylenol or 3200 mg of ibuprofen in a 24 hour period as this could damage your stomach, kidneys, liver, or increase your bleeding risk.  May take muscle relaxer as needed for severe pain / spasm.  (This medication may cause you to become tired so it is important you do not drink alcohol or operate heavy machinery while on this medication.  Recommend your first dose to be taken before bedtime to monitor for side effects safely    ED Prescriptions    Medication Sig Dispense Auth. Provider   cyclobenzaprine (FLEXERIL) 5 MG tablet Take 1 tablet (5 mg total) by mouth 2 (two) times daily as needed for up to 5 days for muscle spasms. 10 tablet Hall-Potvin, [place], PA-C   naproxen (NAPROSYN) 500 MG tablet Take 1 tablet (500 mg total) by mouth 2  (two) times daily for 14 days. 28 tablet Hall-Potvin, [place], PA-C     PDMP not reviewed this encounter.   Hall-Potvin, [place], [place] 08/03/19 1433

## 2021-11-05 ENCOUNTER — Other Ambulatory Visit: Payer: Self-pay | Admitting: Family Medicine

## 2021-11-05 DIAGNOSIS — Z8249 Family history of ischemic heart disease and other diseases of the circulatory system: Secondary | ICD-10-CM

## 2021-11-05 DIAGNOSIS — G4484 Primary exertional headache: Secondary | ICD-10-CM

## 2021-11-05 DIAGNOSIS — R519 Headache, unspecified: Secondary | ICD-10-CM

## 2021-11-18 ENCOUNTER — Ambulatory Visit
Admission: RE | Admit: 2021-11-18 | Discharge: 2021-11-18 | Disposition: A | Discharge status: Home / Self Care | Payer: Managed Care, Other (non HMO) | Source: Ambulatory Visit | Attending: Family Medicine | Admitting: Family Medicine

## 2021-11-18 DIAGNOSIS — R519 Headache, unspecified: Secondary | ICD-10-CM

## 2021-11-18 DIAGNOSIS — G4484 Primary exertional headache: Secondary | ICD-10-CM

## 2021-11-18 DIAGNOSIS — Z8249 Family history of ischemic heart disease and other diseases of the circulatory system: Secondary | ICD-10-CM

## 2021-11-18 MED ORDER — GADOBENATE DIMEGLUMINE 529 MG/ML IV SOLN
20.0000 mL | Freq: Once | INTRAVENOUS | Status: AC | PRN
  Administered 2021-11-18: 20 mL via INTRAVENOUS

## 2021-12-18 ENCOUNTER — Encounter: Payer: Self-pay | Admitting: *Deleted

## 2021-12-22 ENCOUNTER — Encounter: Payer: Self-pay | Admitting: Psychiatry

## 2021-12-22 ENCOUNTER — Ambulatory Visit (INDEPENDENT_AMBULATORY_CARE_PROVIDER_SITE_OTHER): Payer: Managed Care, Other (non HMO) | Admitting: Psychiatry

## 2021-12-22 VITALS — BP 126/75 | HR 84 | Ht 71.0 in | Wt 269.0 lb

## 2021-12-22 DIAGNOSIS — G4484 Primary exertional headache: Secondary | ICD-10-CM | POA: Diagnosis not present

## 2021-12-22 MED ORDER — INDOMETHACIN 50 MG PO CAPS: ORAL_CAPSULE | ORAL | 6 refills | Status: AC

## 2021-12-22 NOTE — Patient Instructions (Addendum)
Start indomethacin 50 mg as needed for exertional headache. Can also take 30 minutes prior to exertion to help prevent headaches  Primary exercise headache, previously known as primary exertional headache, is a rare headache type brought on by or occurring only during or after physical exercise.  Primary exercise headache is termed "primary" because it's not caused by another condition or disorder. People with new or never-evaluated exercise headache should be carefully assessed by their doctor for an underlying cause and considered for imaging of their brain and blood vessels to exclude conditions that can cause this type of headache.   Primary exercise headache is commonly described as bilateral (on both sides of head) and pulsating. Primary exercise headache generally lasts from five minutes to 48 hours. It may have similar features to migraine including light sensitivity, sound sensitivity, and nausea. Primary exercise headache is more likely to occur in hot weather or at high altitude, but can occur in any weather and at any altitude.  Treatment Primary exercise headache is often self-limiting, which means that it occurs for a period of time, and then stops occurring. Primary exercise headache is often self-limiting to a period of three to six months. Given the self-limited nature of primary exercise headache, people should be advised to avoid excessive exercise or triggering activities if possible. In cases where the headache is mild or builds slowly, warming up before exercising and/or an exercise program that begins slowly and increases in intensity and length over a period of months may prevent primary exercise headache. Indomethacin taken 30-60 minutes before exercise may prevent primary exercise headache in some cases. This medication can occasionally cause upset stomach. Beta-blockers such as nadolol and propranolol have also been reported to be effective for preventing primary exercise  headache, and are reasonable options for people who cannot take naproxen or indomethacin. Since primary exercise headache is self-limiting, it's suggested that treatments be discontinued after six months for reevaluation of their need.

## 2021-12-22 NOTE — Progress Notes (Signed)
Referring:  Blair Heys, MD [address] [place],  [place] [address]  PCP: Blair Heys, MD  Neurology was asked to evaluate Richard Glenn, a 31 year old male for a chief complaint of headaches.  Our recommendations of care will be communicated by shared medical record.    CC:  headaches  History provided from self  HPI:  Medical co-morbidities: anxiety  The patient presents for evaluation of headaches which began one year ago. They are associated with physical exertion and intercourse. They will come on all of the sudden, and slowly build until he stops. They are not associated with photophobia or phonophobia. He did have one headache with nausea and vomiting. They generally last 30 minutes at a time but he did have a severe one which lasted for an entire week. Headaches do not occur every time he exerts himself. Typically he has one or less per month.  MRI and MRA brain 11/18/21 were unremarkable. He was started on propranolol 10 mg daily. Took this until the prescription ran out. States he did not have headaches but they are relatively rare at baseline so he is not sure if it was helping or not.  Headache History: Onset: 1 year ago Triggers: exertion, sex Aura: no Location: vertex Quality/Description: squeezing Associated Symptoms:  Photophobia: no  Phonophobia: no  Nausea: yes Vomiting: yes Worse with activity?: yes Duration of headaches: 30 minutes to several days  Headache days per month: 1 Headache free days per month: 29  Current Treatment: Abortive none  Preventative Propranolol 10 mg daily  Prior Therapies                                 Propranolol 10 mg daily  LABS: none  IMAGING:  MRI and MRA brain 11/18/21: unremarkable  Imaging independently reviewed on December 22, 2021   Current Outpatient Medications on File Prior to Visit  Medication Sig Dispense Refill   citalopram (CELEXA) 20 MG tablet Take 20 mg by mouth daily.  3    [DISCONTINUED] albuterol (PROVENTIL HFA;VENTOLIN HFA) 108 (90 Base) MCG/ACT inhaler Inhale 2 puffs into the lungs every 6 (six) hours as needed for wheezing or shortness of breath. 1 Inhaler 0   No current facility-administered medications on file prior to visit.     Allergies: No Known Allergies  Family History: Migraine or other headaches in the family:  no Aneurysms in a first degree relative:  no but aunt had brain aneurysm Brain tumors in the family:  no Other neurological illness in the family:   father had seizures  Past Medical History: Past Medical History:  Diagnosis Date   Exertional headache     Past Surgical History History reviewed. No pertinent surgical history.  Social History: Social History   Tobacco Use   Smoking status: Never   Smokeless tobacco: Never  Substance Use Topics   Alcohol use: Yes    Comment: 1-2 week   Drug use: Never     ROS: Negative for fevers, chills. Positive for headaches. All other systems reviewed and negative unless stated otherwise in HPI.   Physical Exam:   Vital Signs: BP 126/75   Pulse 84   Ht 5\' 11"  (1.803 m)   Wt 269 lb (122 kg)   BMI 37.52 kg/m  GENERAL: well appearing,in no acute distress,alert SKIN:  Color, texture, turgor normal. No rashes or lesions HEAD:  Normocephalic/atraumatic. CV:  RRR  RESP: Normal respiratory effort MSK: no tenderness to palpation over occiput, neck, or shoulders  NEUROLOGICAL: Mental Status: Alert, oriented to person, place and time,Follows commands Cranial Nerves: PERRL, no papilledema visualized, visual fields intact to confrontation, extraocular movements intact, facial sensation intact, no facial droop or ptosis, hearing grossly intact, no dysarthria Motor: muscle strength 5/5 both upper and lower extremities Reflexes: 2+ throughout Sensation: intact to light touch all 4 extremities Coordination: Finger-to- nose-finger intact bilaterally Gait:  normal-based   IMPRESSION: 31 year old male with a history of anxiety who presents for evaluation of exertional headaches. MRI and MRA were unremarkable with no evidence of aneurysm or vasoconstriction. His neurological exam today is normal. Headache pattern is most consistent with primary exercise headache. Will start indomethacin as-needed, as his headaches are relatively low frequency. Advised that he can also take this 30 minutes before exertion to help prevent headache.  PLAN: -Start Indomethacin 50 mg as needed for exertional headache. May also take 30-60 minutes prior to exercise to help prevent headache -next steps: consider restarting propranolol if headaches increase in frequency  I spent a total of 25 minutes chart reviewing and counseling the patient. Headache education was done. Discussed treatment options including acute medications. Written educational materials and patient instructions outlining all of the above were given.  Follow-up: 6 months   Ocie Doyne, MD 12/22/2021   9:55 AM

## 2022-05-21 IMAGING — MR MR MRA HEAD W/O CM
1 series · 10 of 48 positions shown · non-contrast
Comparison: No pertinent prior exams available for comparison.

CLINICAL DATA: Provided history: Exertional headache. Severe
frontal headaches. Family history of cerebral aneurysm. Additional
history provided by scanning technologist: Patient reports a family
history of brain cancer.

EXAM:
MRI HEAD WITHOUT CONTRAST
MRA HEAD WITHOUT CONTRAST
TECHNIQUE: Multiplanar, multi-echo pulse sequences of the brain and surrounding
structures were acquired without intravenous contrast. Angiographic
images of the Circle of Willis were acquired using MRA technique
without intravenous contrast.

[Series 9: [id number] · axial · 0.6mm · 0.26mm/px · z∈[-61,+21]mm · 10 of 175 slices shown]
[im 12/175]
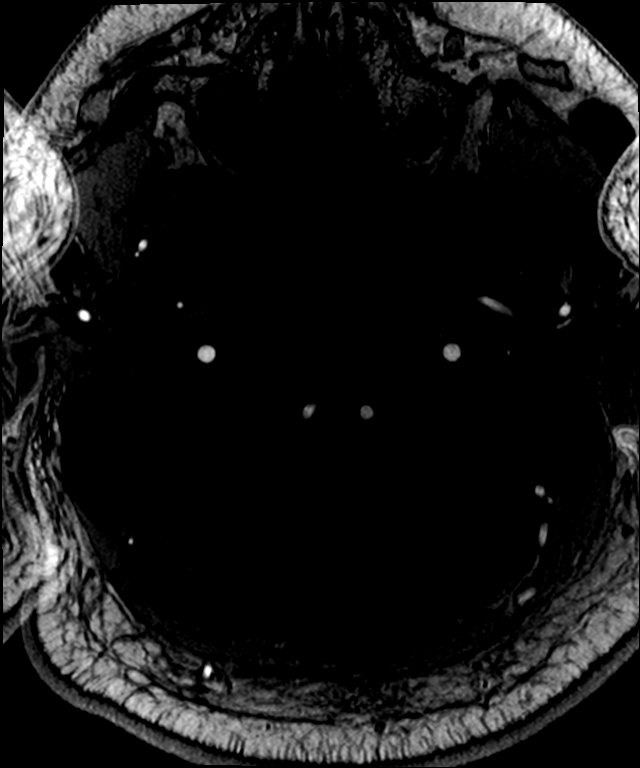
[im 30/175]
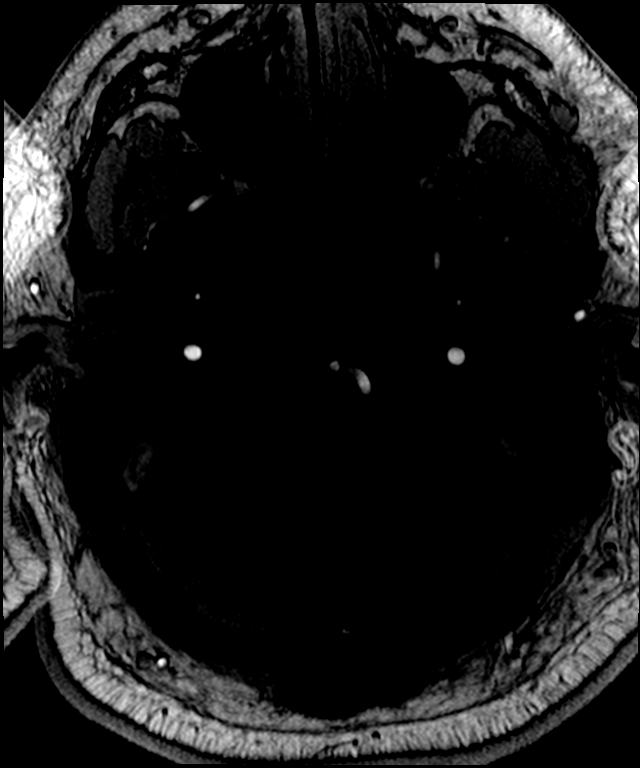
[im 34/175]
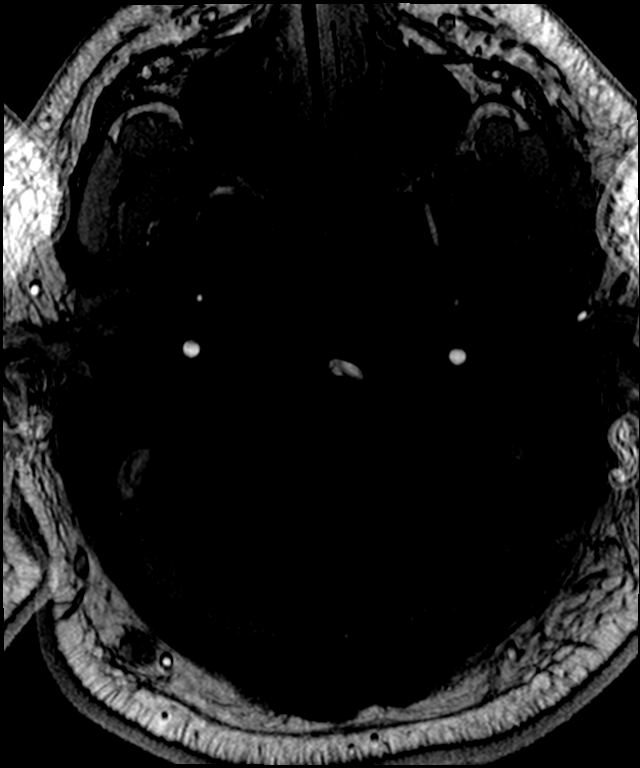
[im 56/175]
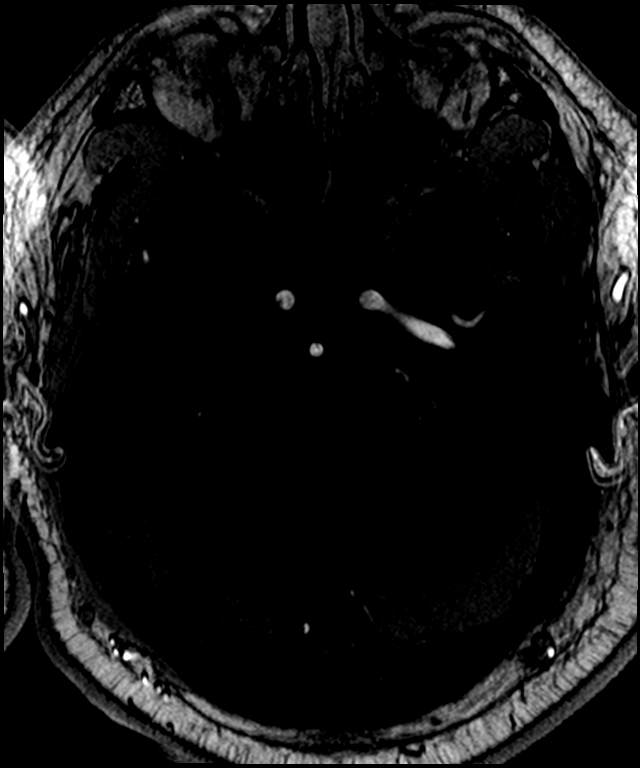
[im 78/175]
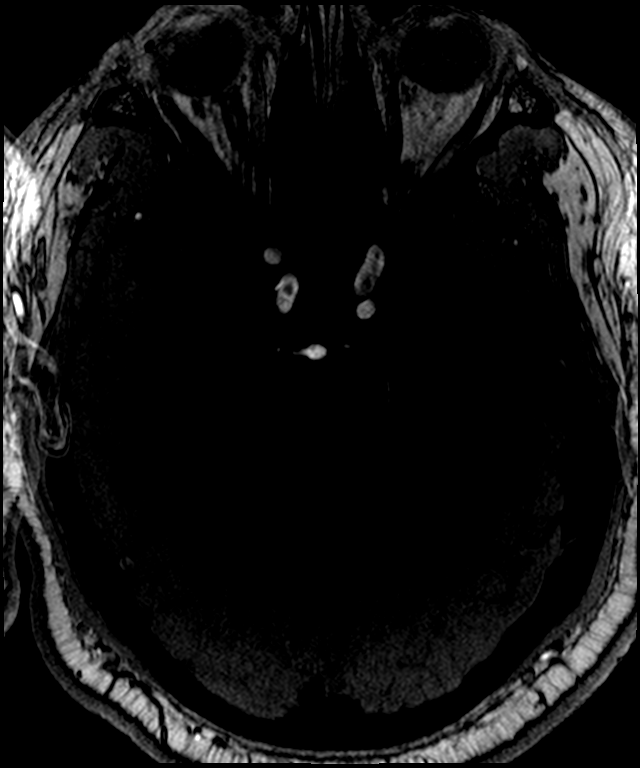
[im 89/175]
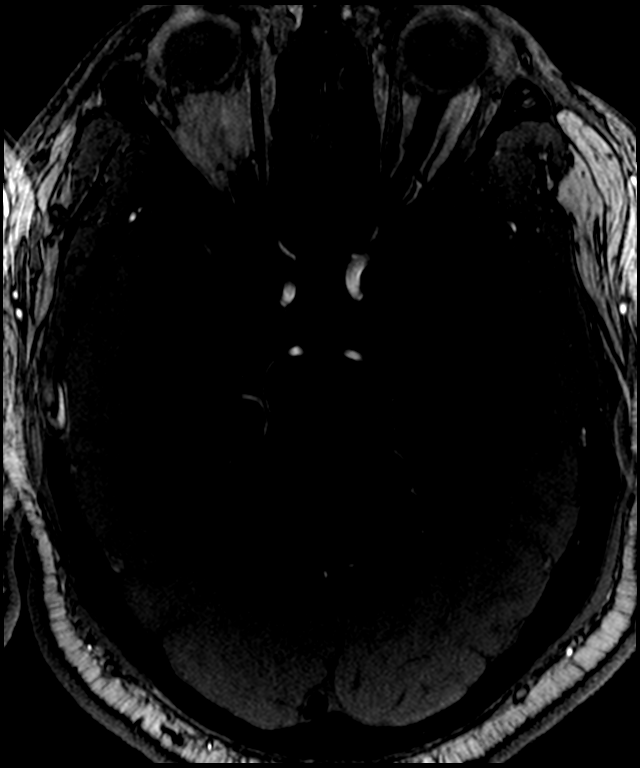
[im 100/175]
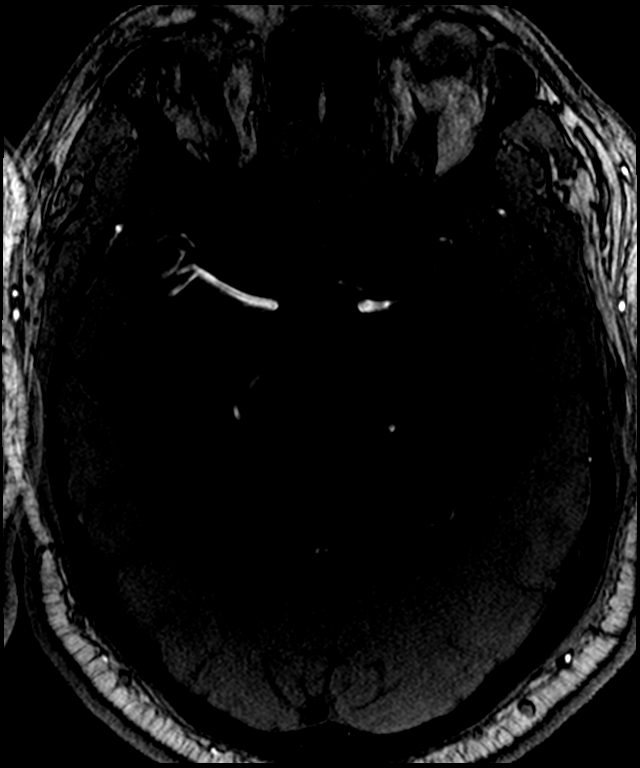
[im 123/175]
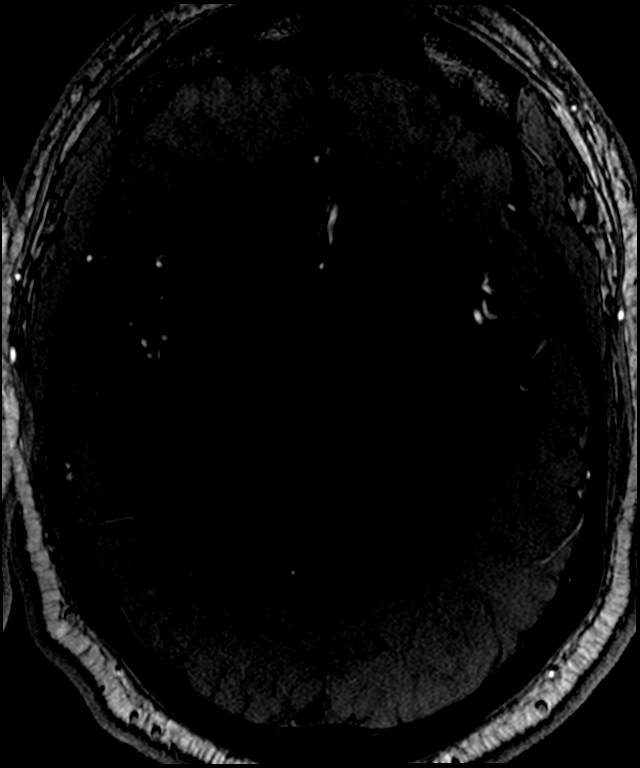
[im 145/175]
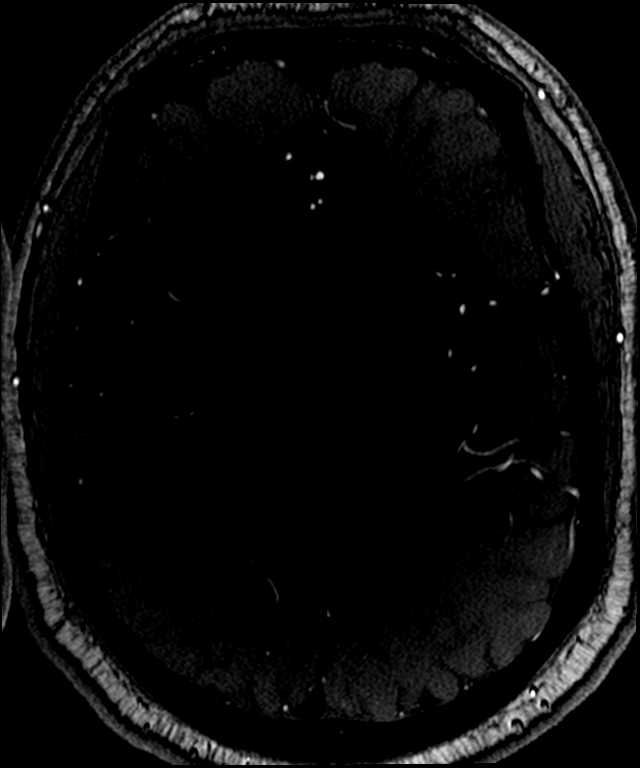
[im 149/175]
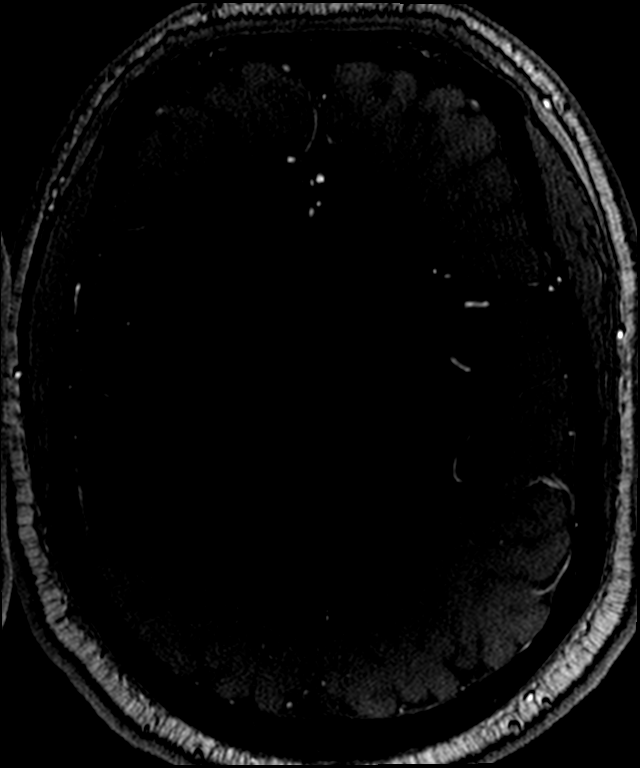

[10 of 48 positions shown; findings below may reference images not displayed]

FINDINGS: MRI HEAD FINDINGS

Brain:

Cerebral volume is normal.

No cortical encephalomalacia is identified. No significant cerebral
white matter disease.

There is no acute infarct.

No evidence of an intracranial mass.

No chronic intracranial blood products.

No extra-axial fluid collection.

No midline shift.

Vascular: Maintained flow voids within the proximal large arterial
vessels.

Skull and upper cervical spine: No focal suspicious marrow lesion.

Sinuses/Orbits: No mass or acute finding within the imaged orbits.
Tiny mucous retention cyst within the left maxillary sinus. Trace
mucosal thickening within the bilateral ethmoid air cells.

MRA HEAD FINDINGS

Anterior circulation:

The intracranial internal carotid arteries are patent. The M1 middle
cerebral arteries are patent. No M2 proximal branch occlusion or
high-grade proximal stenosis is identified. The anterior cerebral
arteries are patent. There are three anterior cerebral arteries
beyond the A1 segments. No intracranial aneurysm is identified.

Posterior circulation:

The intracranial vertebral arteries are patent. The basilar artery
is patent. The posterior cerebral arteries are patent. Posterior
communicating arteries are diminutive or absent, bilaterally.

Anatomic variants: As described.
IMPRESSION: MRI brain:

1. Unremarkable non-contrast MRI appearance the brain. No evidence
of acute intracranial abnormality.
2. Minimal paranasal sinus disease, as described.

MRA head:

1. No intracranial large vessel occlusion or proximal high-grade
arterial stenosis.
2. No intracranial aneurysm is identified.

## 2022-05-22 ENCOUNTER — Other Ambulatory Visit: Payer: Self-pay

## 2022-05-22 ENCOUNTER — Encounter (HOSPITAL_BASED_OUTPATIENT_CLINIC_OR_DEPARTMENT_OTHER): Payer: Self-pay | Admitting: Emergency Medicine

## 2022-05-22 ENCOUNTER — Emergency Department (HOSPITAL_BASED_OUTPATIENT_CLINIC_OR_DEPARTMENT_OTHER)
Admission: EM | Admit: 2022-05-22 | Discharge: 2022-05-22 | Disposition: A | Discharge status: Home / Self Care | Payer: Managed Care, Other (non HMO) | Attending: Emergency Medicine | Admitting: Emergency Medicine

## 2022-05-22 DIAGNOSIS — H60332 Swimmer's ear, left ear: Secondary | ICD-10-CM | POA: Diagnosis not present

## 2022-05-22 DIAGNOSIS — H9202 Otalgia, left ear: Secondary | ICD-10-CM | POA: Diagnosis present

## 2022-05-22 MED ORDER — NAPROXEN 250 MG PO TABS
500.0000 mg | ORAL_TABLET | Freq: Once | ORAL | Status: AC
  Administered 2022-05-22: 500 mg via ORAL
  Filled 2022-05-22: qty 2

## 2022-05-22 MED ORDER — NEOMYCIN-POLYMYXIN-HC 3.5-10000-1 OT SUSP
4.0000 [drp] | Freq: Four times a day (QID) | OTIC | Status: DC
  Administered 2022-05-22: 4 [drp] via OTIC
  Filled 2022-05-22: qty 10

## 2022-05-22 MED ORDER — HYDROCODONE-ACETAMINOPHEN 5-325 MG PO TABS
1.0000 | ORAL_TABLET | Freq: Once | ORAL | Status: AC
  Administered 2022-05-22: 1 via ORAL
  Filled 2022-05-22: qty 1

## 2022-05-22 NOTE — ED Provider Notes (Signed)
[facility] Provider Note: Richard Dell, MD, FACEP  CSN: [id number] MRN: [id number] ARRIVAL: 05/22/22 at 0449 ROOM: DB016/DB016   CHIEF COMPLAINT  Ear Pain   HISTORY OF PRESENT ILLNESS  05/22/22 5:01 AM Richard Glenn is a 31 y.o. male with recent sneezing and nasal congestion.  He is here with 2 days of left ear pain radiating to his left jaw.  He is also having reduced hearing in the left ear.  He rates his pain as an 8 out of 10, not relieved with Tylenol.  Pain is worse with movement of his external ear.  He has not noticed any drainage.  He has had multiple ear infections in the past and this feels similar.   Past Medical History:  Diagnosis Date   Exertional headache     Past Surgical History:  Procedure Laterality Date   SKIN BIOPSY      Family History  Problem Relation Age of Onset   Throat cancer Father    Hypertension Father    Alzheimer's disease Paternal Grandmother    Diabetes Paternal Grandfather     Social History   Tobacco Use   Smoking status: Never   Smokeless tobacco: Never  Substance Use Topics   Alcohol use: Yes    Comment: 1-2 week   Drug use: Never    Prior to Admission medications   Medication Sig Start Date End Date Taking? Authorizing Provider  citalopram (CELEXA) 20 MG tablet Take 20 mg by mouth daily. 03/09/17   [provider]  indomethacin (INDOCIN) 50 MG capsule Take one pill as needed for exertional headache. May also take 30 minutes prior to exertion to prevent headache. 12/22/21   Ocie Doyne, MD  albuterol (PROVENTIL HFA;VENTOLIN HFA) 108 (90 Base) MCG/ACT inhaler Inhale 2 puffs into the lungs every 6 (six) hours as needed for wheezing or shortness of breath. 10/10/18 08/01/19  Junie Spencer, FNP    Allergies Patient has no known allergies.   REVIEW OF SYSTEMS  Negative except as noted here or in the History of Present Illness.   PHYSICAL EXAMINATION  Initial Vital Signs Blood pressure 134/81,  pulse 96, temperature 98.9 F (37.2 C), temperature source Oral, resp. rate 20, height 5\' 11"  (1.803 m), weight 122.5 kg, SpO2 99 %.  Examination General: Well-developed, well-nourished male in no acute distress; appearance consistent with age of record HENT: normocephalic; atraumatic; right ear normal; pain on movement of left external ear, edema of left external auditory canal partially obscures TM but TM nonerythematous Eyes: Normal appearance Neck: supple Heart: regular rate and rhythm Lungs: clear to auscultation bilaterally Abdomen: soft; nondistended; nontender; bowel sounds present Extremities: No deformity; full range of motion Neurologic: Awake, alert and oriented; motor function intact in all extremities and symmetric; no facial droop Skin: Warm and dry Psychiatric: Normal mood and affect   RESULTS  Summary of this visit's results, reviewed and interpreted by myself:   EKG Interpretation  Date/Time:    Ventricular Rate:    PR Interval:    QRS Duration:   QT Interval:    QTC Calculation:   R Axis:     Text Interpretation:         Laboratory Studies: No results found for this or any previous visit (from the past 24 hour(s)). Imaging Studies: No results found.  ED COURSE and MDM  Nursing notes, initial and subsequent vitals signs, including pulse oximetry, reviewed and interpreted by myself.  Vitals:   05/22/22 0459  BP: 134/81  Pulse: 96  Resp: 20  Temp: 98.9 F (37.2 C)  TempSrc: Oral  SpO2: 99%  Weight: 122.5 kg  Height: 5\' 11"  (1.803 m)   Medications  neomycin-polymyxin-hydrocortisone (CORTISPORIN) OTIC (EAR) suspension 4 drop (has no administration in time range)    Examination is most consistent with an acute otitis externa which the patient has had before.  He does have some preauricular tenderness which could indicate TMJ.  He was advised that we will treat for otitis externa but if symptoms persist he should consult his dentist regarding  evaluation for TMJ.  PROCEDURES  Procedures   ED DIAGNOSES     ICD-10-CM   1. Acute swimmer's ear of left side  H60.332          Eligha Kmetz, , MD 05/22/22 [phone]

## 2022-05-22 NOTE — ED Triage Notes (Signed)
Pt presents for L ear pain which radiates into his jaw x 2 days, not relieved by tylenol.  Endorses recent sneezing, rhinorrhea, reduced hearing in L ear.  Denies pain with swallowing, fever, chills H/o frequent ear infections.

## 2022-05-23 ENCOUNTER — Emergency Department (HOSPITAL_COMMUNITY)
Admission: EM | Admit: 2022-05-23 | Discharge: 2022-05-23 | Disposition: A | Discharge status: Home / Self Care | Payer: Managed Care, Other (non HMO) | Attending: Emergency Medicine | Admitting: Emergency Medicine

## 2022-05-23 ENCOUNTER — Encounter (HOSPITAL_COMMUNITY): Payer: Self-pay

## 2022-05-23 ENCOUNTER — Other Ambulatory Visit: Payer: Self-pay

## 2022-05-23 DIAGNOSIS — H6692 Otitis media, unspecified, left ear: Secondary | ICD-10-CM | POA: Diagnosis not present

## 2022-05-23 DIAGNOSIS — H9202 Otalgia, left ear: Secondary | ICD-10-CM | POA: Diagnosis present

## 2022-05-23 DIAGNOSIS — H6690 Otitis media, unspecified, unspecified ear: Secondary | ICD-10-CM

## 2022-05-23 MED ORDER — AMOXICILLIN 500 MG PO CAPS: 1000.0000 mg | ORAL_CAPSULE | Freq: Two times a day (BID) | ORAL | 0 refills | Status: AC

## 2022-05-23 MED ORDER — OXYCODONE-ACETAMINOPHEN 5-325 MG PO TABS: 1.0000 | ORAL_TABLET | Freq: Four times a day (QID) | ORAL | 0 refills | Status: AC | PRN

## 2022-05-23 MED ORDER — KETOROLAC TROMETHAMINE 30 MG/ML IJ SOLN
30.0000 mg | Freq: Once | INTRAMUSCULAR | Status: AC
  Administered 2022-05-23: 30 mg via INTRAMUSCULAR
  Filled 2022-05-23: qty 1

## 2022-05-23 MED ORDER — AMOXICILLIN 500 MG PO CAPS
500.0000 mg | ORAL_CAPSULE | Freq: Once | ORAL | Status: AC
  Administered 2022-05-23: 500 mg via ORAL
  Filled 2022-05-23: qty 1

## 2022-05-23 MED ORDER — SENNOSIDES-DOCUSATE SODIUM 8.6-50 MG PO TABS: 1.0000 | ORAL_TABLET | Freq: Every evening | ORAL | 0 refills | Status: AC | PRN

## 2022-05-23 NOTE — ED Provider Notes (Signed)
Emergency Department Provider Note   I have reviewed the triage vital signs and the nursing notes.   HISTORY  Chief Complaint Otitis Media   HPI Richard Glenn is a 31 y.o. male returns to the emergency department with left ear pain worsening in the past 24 hours.  Denies discharge or bleeding.  He was seen in the ED last night and discharged home with eardrops.  Has been using diet and taking naproxen along with Tylenol for pain but symptoms worsening.  Denies any pain behind the ear.  Pain is radiating down to under the jaw at this point.  No difficulty opening his mouth but some pain with chewing.  Denies sore throat, trouble breathing, difficulty swallowing.   Past Medical History:  Diagnosis Date   Exertional headache     Review of Systems  Constitutional: No fever/chills Eyes: No visual changes. ENT: No sore throat. Positive left ear pain.  Cardiovascular: Denies chest pain. Respiratory: Denies shortness of breath. Gastrointestinal: No abdominal pain.  Skin: Negative for rash. Neurological: Negative for headaches.   ____________________________________________   PHYSICAL EXAM:  VITAL SIGNS: ED Triage Vitals  Enc Vitals Group     BP 05/23/22 0037 (!) 149/81     Pulse Rate 05/23/22 0037 (!) 106     Resp 05/23/22 0037 18     Temp 05/23/22 0037 98.6 F (37 C)     Temp Source 05/23/22 0037 Oral     SpO2 05/23/22 0037 96 %     Weight 05/23/22 0033 270 lb (122.5 kg)     Height 05/23/22 0033 5\' 11"  (1.803 m)   Constitutional: Alert and oriented. Well appearing and in no acute distress. Eyes: Conjunctivae are normal.  Head: Atraumatic. Ears: Inflammation to the left ear canal without bleeding or severe discharge.  There is some erythema over the TM on the left.  No mastoid tenderness.  No pain with manipulation of the pinna. Mild posterior auricular adenopathy.  Nose: No congestion/rhinnorhea. Mouth/Throat: Mucous membranes are moist.  Oropharynx  non-erythematous. No PTA. No trismus. Clear voice. Managing oral secretions.  Neck: No stridor.  Cardiovascular: Good peripheral circulation.  Respiratory: Normal respiratory effort.  Gastrointestinal: No distention.  Musculoskeletal: No gross deformities of extremities. Neurologic:  Normal speech and language. Skin:  Skin is warm, dry and intact. No rash noted.  ____________________________________________   PROCEDURES  Procedure(s) performed:   Procedures  None  ____________________________________________   INITIAL IMPRESSION / ASSESSMENT AND PLAN / ED COURSE  Pertinent labs & imaging results that were available during my care of the patient were reviewed by me and considered in my medical decision making (see chart for details).   This patient is Presenting for Evaluation of ear pain, which does require a range of treatment options, and is a complaint that involves a moderate risk of morbidity and mortality.  The Differential Diagnoses include otitis media, otitis externa, mastoiditis, etc.  I decided to review pertinent External Data, and in summary patient seen in the ED yesterday. D/c home with otic drops. No oral abx.   Medical Decision Making: Summary:  Patient presents emergency department with left ear pain worsening since yesterday despite compliance with otic drops.  I do not appreciate any clinical signs to suggest mastoiditis or malignant otitis externa although these diagnoses were considered.  He also has no evidence of oropharyngeal abnormality.  No evidence of Ludwig's angina. Plan to broaden coverage to Amoxicillin along with continued otic drops. Advised NSAIDs/Tylenol for pain primarily but did  send small number of Percocet, after PDMP review, for breakthrough pain and help with sleep. Discussed side effects in detail and gave driving precautions. Provided contact info for ENT on call in case symptoms worsen.   Disposition:  discharge  ____________________________________________  FINAL CLINICAL IMPRESSION(S) / ED DIAGNOSES  Final diagnoses:  Acute otitis media, unspecified otitis media type     NEW OUTPATIENT MEDICATIONS STARTED DURING THIS VISIT:  Discharge Medication List as of 05/23/2022  3:29 AM     START taking these medications   Details  amoxicillin (AMOXIL) 500 MG capsule Take 2 capsules (1,000 mg total) by mouth 2 (two) times daily for 7 days., Starting Sat 05/23/2022, Until Sat 05/30/2022, Normal    oxyCODONE-acetaminophen (PERCOCET/ROXICET) 5-325 MG tablet Take 1 tablet by mouth every 6 (six) hours as needed for severe pain., Starting Sat 05/23/2022, Normal    senna-docusate (SENOKOT-S) 8.6-50 MG tablet Take 1 tablet by mouth at bedtime as needed for mild constipation., Starting Sat 05/23/2022, Normal        Note:  This document was prepared using Dragon voice recognition software and may include unintentional dictation errors.  Alona Bene, MD, [facility]    Joquan Lotz, Arlyss Repress, MD 05/23/22 [phone]

## 2022-05-23 NOTE — ED Triage Notes (Signed)
States that he was just at a ER and was diagnosed with a ear infection and was told to come back if the pain worsens, he states he has been unable to eat anything all day due to the stabbing pain in his ear.

## 2022-05-23 NOTE — Discharge Instructions (Signed)
Please take your antibiotics as prescribed.  I would continue the eardrops as well.  I have listed the name for an ear nose and throat in case the symptoms gradually worsened.  If you develop sudden worsening symptoms I would like for you to turn to the emergency department for reevaluation.  You cannot take Percocet while driving.  It will cause constipation so I called in some medicine for that as well.  Do not take Percocet with alcohol.

## 2022-07-02 ENCOUNTER — Ambulatory Visit: Payer: Managed Care, Other (non HMO) | Admitting: Psychiatry
